# Patient Record
Sex: Female | Born: 1995 | Race: Black or African American | Hispanic: No | Marital: Single | Smoking: Current some day smoker
Health system: Southern US, Community
[De-identification: ages and names within clinical notes are randomized; demographics above are authoritative.]

## PROBLEM LIST (undated history)

## (undated) HISTORY — PX: CARDIAC SURGERY: SHX584

---

## 2016-01-13 ENCOUNTER — Encounter (HOSPITAL_COMMUNITY): Payer: Self-pay

## 2016-01-13 ENCOUNTER — Emergency Department (HOSPITAL_COMMUNITY)
Admission: EM | Admit: 2016-01-13 | Discharge: 2016-01-13 | Disposition: A | Discharge status: Home / Self Care | Payer: BC Managed Care – PPO | Attending: Emergency Medicine | Admitting: Emergency Medicine

## 2016-01-13 DIAGNOSIS — N939 Abnormal uterine and vaginal bleeding, unspecified: Secondary | ICD-10-CM | POA: Insufficient documentation

## 2016-01-13 LAB — CBC WITH DIFFERENTIAL/PLATELET
BASOS PCT: 1 %
Basophils Absolute: 0 10*3/uL (ref 0.0–0.1)
EOS ABS: 0.3 10*3/uL (ref 0.0–0.7)
EOS PCT: 4 %
HCT: 37.5 % (ref 36.0–46.0)
HEMOGLOBIN: 13.2 g/dL (ref 12.0–15.0)
LYMPHS ABS: 3.1 10*3/uL (ref 0.7–4.0)
Lymphocytes Relative: 38 %
MCH: 26.9 pg (ref 26.0–34.0)
MCHC: 35.2 g/dL (ref 30.0–36.0)
MCV: 76.4 fL — ABNORMAL LOW (ref 78.0–100.0)
MONO ABS: 0.6 10*3/uL (ref 0.1–1.0)
MONOS PCT: 7 %
NEUTROS PCT: 50 %
Neutro Abs: 4.2 10*3/uL (ref 1.7–7.7)
PLATELETS: 329 10*3/uL (ref 150–400)
RBC: 4.91 MIL/uL (ref 3.87–5.11)
RDW: 13.4 % (ref 11.5–15.5)
WBC: 8.3 10*3/uL (ref 4.0–10.5)

## 2016-01-13 LAB — WET PREP, GENITAL
Clue Cells Wet Prep HPF POC: NONE SEEN
Sperm: NONE SEEN
Trich, Wet Prep: NONE SEEN
Yeast Wet Prep HPF POC: NONE SEEN

## 2016-01-13 LAB — I-STAT BETA HCG BLOOD, ED (MC, WL, AP ONLY): I-stat hCG, quantitative: <5 m[IU]/mL

## 2016-01-13 MED ORDER — HYDROCODONE-ACETAMINOPHEN 5-325 MG PO TABS
1.0000 | ORAL_TABLET | Freq: Once | ORAL | Status: AC
  Administered 2016-01-13: 1 via ORAL
  Filled 2016-01-13: qty 1

## 2016-01-13 NOTE — ED Triage Notes (Signed)
Patient complains of heavy vaginal bleeding x 3 weeks. States that she is on depo and hasn't missed her injection. Reports some abdominal cramping with same. Currently taking flagyl for infection

## 2016-01-13 NOTE — ED Provider Notes (Signed)
[facility] DEPT Provider Note   CSN: [id number] Arrival date & time: 01/13/16  1505     History   Chief Complaint Chief Complaint  Patient presents with  . Vaginal Bleeding    HPI Miranda Case is a 20 y.o. female.   Vaginal Bleeding  Primary symptoms include pelvic pain, dyspareunia, vaginal bleeding.  Primary symptoms include no dysuria. There has been no fever. This is a new problem. The current episode started more than 1 week ago. The problem occurs daily. The problem has not changed since onset.She is not pregnant. She has not missed her period. Her LMP is unknown. The patient's menstrual history has been irregular. The discharge was bloody. Pertinent negatives include no anorexia, no diaphoresis, no abdominal swelling, no abdominal pain, no constipation, no diarrhea, no vomiting and no light-headedness. She has tried nothing for the symptoms. The treatment provided no relief. Sexual activity: non-contributory. There is no concern regarding sexually transmitted diseases. Contraceptive use: Depoprevera. Associated medical issues include vaginosis. Associated medical issues do not include STD or PID.    History reviewed. No pertinent past medical history.  There are no active problems to display for this patient.   History reviewed. No pertinent surgical history.  OB History    No data available       Home Medications    Prior to Admission medications   Not on File    Family History No family history on file.  Social History Social History  Substance Use Topics  . Smoking status: Never Smoker  . Smokeless tobacco: Not on file  . Alcohol use Not on file     Allergies   Patient has no known allergies.   Review of Systems Review of Systems  Constitutional: Negative for chills, diaphoresis and fever.  HENT: Negative for ear pain and sore throat.   Eyes: Negative for pain and visual disturbance.  Respiratory: Negative for cough and shortness of breath.    Cardiovascular: Negative for chest pain and palpitations.  Gastrointestinal: Negative for abdominal pain, anorexia, constipation, diarrhea and vomiting.  Genitourinary: Positive for dyspareunia, pelvic pain and vaginal bleeding. Negative for dysuria and hematuria.  Musculoskeletal: Negative for arthralgias and back pain.  Skin: Negative for color change and rash.  Neurological: Negative for seizures, syncope and light-headedness.  All other systems reviewed and are negative.    Physical Exam Updated Vital Signs BP 99/75   Pulse 82   Temp 98.4 F (36.9 C) (Oral)   Resp 16   SpO2 100%   Physical Exam  Constitutional: She appears well-developed and well-nourished. No distress.  HENT:  Head: Normocephalic and atraumatic.  Eyes: Conjunctivae are normal.  Neck: Neck supple.  Cardiovascular: Normal rate and regular rhythm.   No murmur heard. Pulmonary/Chest: Effort normal and breath sounds normal. No respiratory distress.  Abdominal: Soft. There is no tenderness. There is no rigidity, no rebound and no CVA tenderness.  Genitourinary: Uterus normal. Pelvic exam was performed with patient supine. There is no rash, tenderness or lesion on the right labia. There is no rash, tenderness or lesion on the left labia. Uterus is not tender. Cervix exhibits no motion tenderness, no discharge and no friability. Right adnexum displays no mass and no tenderness. Left adnexum displays no mass and no tenderness. There is bleeding in the vagina. No erythema in the vagina. No foreign body in the vagina. No signs of injury around the vagina. No vaginal discharge found.  Musculoskeletal: She exhibits no edema.  Neurological: She is alert.  Skin: Skin is warm and dry.  Psychiatric: She has a normal mood and affect.  Nursing note and vitals reviewed.    ED Treatments / Results  Labs (all labs ordered are listed, but only abnormal results are displayed) Labs Reviewed  WET PREP, GENITAL - Abnormal;  Notable for the following:       Result Value   WBC, Wet Prep HPF POC MODERATE (*)    All other components within normal limits  CBC WITH DIFFERENTIAL/PLATELET - Abnormal; Notable for the following:    MCV 76.4 (*)    All other components within normal limits  I-STAT BETA HCG BLOOD, ED ([facility], [facility], [facility] ONLY)  GC/CHLAMYDIA PROBE AMP ([place]) NOT AT [facility]    EKG  EKG Interpretation None       Radiology No results found.  Procedures Procedures (including critical care time)  Medications Ordered in ED Medications  HYDROcodone-acetaminophen (NORCO/VICODIN) 5-325 MG per tablet 1 tablet (1 tablet Oral Given 01/13/16 2314)     Initial Impression / Assessment and Plan / ED Course  I have reviewed the triage vital signs and the nursing notes.  Pertinent labs & imaging results that were available during my care of the patient were reviewed by me and considered in my medical decision making (see chart for details).  Clinical Course     20 year old female comes today with vaginal bleeding. She's been seen by her OB/GYN twice for this they believe is related to bacterial vaginosis. She's been having pelvic cramping, similar to menstrual cramping with it. She says is also painful with sex. She believes she has a latex allergy and uses condoms as there irritating her. However she's had bleeding and trouble before that event happened. Vital signs are stable she's afebrile. Hemoglobin is stable, not tachycardic not hypotensive. No syncope lightheadedness chest pain shortness of breath. Vaginal exam shows blood coming from the cervical os. She is not pregnant. No cervical motion tenderness. No friability or concerns for STD at this time. Patient has dysfunctional uterine bleeding of unknown origin at this time. She needs further outpatient management possibly a transvaginal ultrasound. She's told this and she's told for the menstrual pain she can do NSAIDs. She has tried nothing for the pain thus  far. Pain is tolerable for her. Wet prep is negative GC is pending by this time no need for treatment. Vital signs stable time discharge. Strict return precautions are given.  Final Clinical Impressions(s) / ED Diagnoses   Final diagnoses:  Vaginal bleeding    New Prescriptions There are no discharge medications for this patient.    Cherlynn PerchesEric Famous Eisenhardt, MD 01/13/16 [id number]    Canary Brimhristopher J Tegeler, MD 01/14/16 1121

## 2016-01-14 LAB — GC/CHLAMYDIA PROBE AMP (~~LOC~~) NOT AT ARMC
CHLAMYDIA, DNA PROBE: NEGATIVE
NEISSERIA GONORRHEA: NEGATIVE

## 2016-02-13 ENCOUNTER — Emergency Department (HOSPITAL_COMMUNITY): Payer: BC Managed Care – PPO

## 2016-02-13 ENCOUNTER — Encounter (HOSPITAL_COMMUNITY): Payer: Self-pay | Admitting: Emergency Medicine

## 2016-02-13 ENCOUNTER — Emergency Department (HOSPITAL_COMMUNITY)
Admission: EM | Admit: 2016-02-13 | Discharge: 2016-02-13 | Disposition: A | Discharge status: Home / Self Care | Payer: BC Managed Care – PPO | Attending: Physician Assistant | Admitting: Physician Assistant

## 2016-02-13 DIAGNOSIS — Y93G3 Activity, cooking and baking: Secondary | ICD-10-CM | POA: Diagnosis not present

## 2016-02-13 DIAGNOSIS — T300 Burn of unspecified body region, unspecified degree: Secondary | ICD-10-CM

## 2016-02-13 DIAGNOSIS — Z23 Encounter for immunization: Secondary | ICD-10-CM | POA: Diagnosis not present

## 2016-02-13 DIAGNOSIS — Y999 Unspecified external cause status: Secondary | ICD-10-CM | POA: Insufficient documentation

## 2016-02-13 DIAGNOSIS — Y929 Unspecified place or not applicable: Secondary | ICD-10-CM | POA: Diagnosis not present

## 2016-02-13 DIAGNOSIS — X102XXA Contact with fats and cooking oils, initial encounter: Secondary | ICD-10-CM | POA: Insufficient documentation

## 2016-02-13 DIAGNOSIS — T23171A Burn of first degree of right wrist, initial encounter: Secondary | ICD-10-CM | POA: Insufficient documentation

## 2016-02-13 DIAGNOSIS — T23001A Burn of unspecified degree of right hand, unspecified site, initial encounter: Secondary | ICD-10-CM | POA: Diagnosis present

## 2016-02-13 MED ORDER — OXYCODONE-ACETAMINOPHEN 5-325 MG PO TABS
1.0000 | ORAL_TABLET | Freq: Once | ORAL | Status: AC
  Administered 2016-02-13: 1 via ORAL
  Filled 2016-02-13: qty 1

## 2016-02-13 MED ORDER — SILVER SULFADIAZINE 1 % EX CREA
TOPICAL_CREAM | Freq: Once | CUTANEOUS | Status: AC
  Administered 2016-02-13: 23:00:00 via TOPICAL
  Filled 2016-02-13: qty 85

## 2016-02-13 MED ORDER — OXYCODONE-ACETAMINOPHEN 5-325 MG PO TABS: 1.0000 | ORAL_TABLET | Freq: Four times a day (QID) | ORAL | 0 refills | Status: DC | PRN

## 2016-02-13 MED ORDER — TETANUS-DIPHTH-ACELL PERTUSSIS 5-2.5-18.5 LF-MCG/0.5 IM SUSP
0.5000 mL | Freq: Once | INTRAMUSCULAR | Status: AC
  Administered 2016-02-13: 0.5 mL via INTRAMUSCULAR
  Filled 2016-02-13: qty 0.5
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
## 2016-02-13 NOTE — Discharge Instructions (Signed)
You had a small burn to your hand. If he gets any worse coming of any concerns, any increased pain, swelling or other concerns please return immediately to the Emergency Department. Otherwise please follow-up with care in 1 week.
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
## 2018-03-01 NOTE — Progress Notes (Signed)
Subjective:    Patient ID: Miranda Case, female    DOB: [birth year], 23 y.o.   MRN: [id number]  HPI Chief Complaint  Patient presents with  . new pt    new pt physical with pap and std,. had pap last year and was abnormal and was told to have another one but can't get in with obgyn. wants to STD screening   She is new to the practice and here for a complete physical exam. [facility]  Last CPE: last year   States she is en[employer] in [employer] in May. Wants to become a doctor.    Other providers: None   Recurrent BV and yeast infections. Self medicating with Boric Acid. History of abnormal pap smear and did not follow up as recommended.   On Depo Provera for 3 years and stopped it in August 2018 and no periods. First period 10/2217 and periods have been irregular since. LMP: 02/20/2018 Sexually active and has 3 female partners. Not currently using birth control.   Complains of right foot dorsal foot pain x 2 months after injuring her foot. Reports swelling and pain has improved but she continues to have a large knot on the top of her foot that is occasionally tender.   Social history: Lives with friends, works as a Fish farm managerpharm tech at [employer] Occasionally smokes and drinks alcohol, denies drug use  Diet: unhealthy snacks Excerise: 2 days per week  Immunizations: flu shot- wants this. Reports having Gardasil vaccine.   Health maintenance:  Mammogram: N/A Colonoscopy:N/A Last [facility]  Pregnancies: 0 Last Dental Exam: never  Last Eye Exam: unknown   Wears seatbelt always, smoke detectors in home and functioning, does not text while driving and feels safe in home environment.   Reviewed allergies, medications, past medical, surgical, family, and social history.   Review of Systems Review of Systems Constitutional: -fever, -chills, -sweats, -unexpected weight change,-fatigue ENT: -runny  nose, -ear pain, -sore throat Cardiology:  -chest pain, -palpitations, -edema Respiratory: -cough, -shortness of breath, -wheezing Gastroenterology: -abdominal pain, -nausea, -vomiting, -diarrhea, -constipation  Hematology: -bleeding or bruising problems Musculoskeletal: -arthralgias, -myalgias, -joint swelling, -back pain Ophthalmology: -vision changes Urology: -dysuria, -difficulty urinating, -hematuria, -urinary frequency, -urgency Neurology: -headache, -weakness, -tingling, -numbness       Objective:   Physical Exam BP 110/64   Pulse 71   Ht 5' 4.5" (1.638 m)   Wt 123 lb 12.8 oz (56.2 kg)   LMP 02/20/2018   SpO2 98%   BMI 20.92 kg/m   General Appearance:    Alert, cooperative, no distress, appears stated age  Head:    Normocephalic, without obvious abnormality, atraumatic  Eyes:    PERRL, conjunctiva/corneas clear, EOM's intact, fundi    benign  Ears:    Normal TM's and external ear canals  Nose:   Nares normal, mucosa normal, no drainage or sinus   tenderness  Throat:   Lips, mucosa, and tongue normal; teeth and gums normal  Neck:   Supple, no lymphadenopathy;  thyroid:  no   enlargement/tenderness/nodules; no carotid   bruit or JVD  Back:    Spine nontender, no curvature, ROM normal, no CVA     tenderness  Lungs:     Clear to auscultation bilaterally without wheezes, rales or     ronchi; respirations unlabored  Chest Wall:    No tenderness or deformity   Heart:    Regular rate and rhythm, S1  and S2 normal, no murmur, rub   or gallop  Breast Exam:    OB/GYN  Abdomen:     Soft, non-tender, nondistended, normoactive bowel sounds,    no masses, no hepatosplenomegaly  Genitalia:    OB/GYN  Rectal:    Not performed due to age<40 and no related complaints  Extremities:   No clubbing, cyanosis or edema. Right dorsal mid foot with area of fullness, non tender. Right foot is neurovascularly intact.   Pulses:   2+ and symmetric all extremities  Skin:   Skin color, texture,  turgor normal, maculopapular rash to right anterior lower leg  Lymph nodes:   Cervical, supraclavicular, and axillary nodes normal  Neurologic:   CNII-XII intact, normal strength, sensation and gait; reflexes 2+ and symmetric throughout          Psych:   Normal mood, affect, hygiene and grooming.     Urinalysis dipstick: negative UPT negative       Assessment & Plan:  Routine general medical examination at a health care facility - Plan: POCT Urinalysis DIP (Proadvantage Device), CBC with Differential/Platelet, Comprehensive metabolic panel, TSH, T4, free, T3, Lipid panel  History of abnormal cervical Pap smear  At risk for sexually transmitted disease due to unprotected sex - Plan: POCT urine pregnancy, HIV Antibody (routine testing w rflx), RPR, Hepatitis C antibody, GC/Chlamydia Probe Amp  Irregular menses - Plan: POCT urine pregnancy, TSH, T4, free, T3  Recurrent vaginitis  Needs flu shot - Plan: Flu Vaccine QUAD 36+ mos IM  Screening for lipid disorders - Plan: Lipid panel  Injury of right foot, initial encounter - Plan: DG Foot Complete Right  Abnormal finding of foot - Plan: DG Foot Complete Right  She is a pleasant 23 year old female who is new to the practice and here for a CPE.  She has several complaints as well. History of recurrent BV, irregular menses and abnormal Pap smear.  Referral to OB/GYN.  Counseling on safe sex as she has multiple sexual partners.  Has not been using contraception.  Discussed the possibility of prep.  She will discuss this with her OB/GYN. 2749-month history of right foot pain that is intermittent and abnormality noted.  X-ray ordered. Counseling on healthy lifestyle including diet and exercise. Immunization counseling done.  Flu shot given. Discussed safety and health promotion. Follow-up pending labs

## 2018-03-02 ENCOUNTER — Encounter: Payer: Self-pay | Admitting: Family Medicine

## 2018-03-02 ENCOUNTER — Ambulatory Visit
Admission: RE | Admit: 2018-03-02 | Discharge: 2018-03-02 | Disposition: A | Discharge status: Home / Self Care | Payer: BC Managed Care – PPO | Source: Ambulatory Visit | Attending: Family Medicine | Admitting: Family Medicine

## 2018-03-02 ENCOUNTER — Ambulatory Visit: Payer: BC Managed Care – PPO | Admitting: Family Medicine

## 2018-03-02 VITALS — BP 110/64 | HR 71 | Ht 64.5 in | Wt 123.8 lb

## 2018-03-02 DIAGNOSIS — Z8742 Personal history of other diseases of the female genital tract: Secondary | ICD-10-CM

## 2018-03-02 DIAGNOSIS — Z9189 Other specified personal risk factors, not elsewhere classified: Secondary | ICD-10-CM | POA: Diagnosis not present

## 2018-03-02 DIAGNOSIS — N760 Acute vaginitis: Secondary | ICD-10-CM | POA: Diagnosis not present

## 2018-03-02 DIAGNOSIS — N926 Irregular menstruation, unspecified: Secondary | ICD-10-CM | POA: Diagnosis not present

## 2018-03-02 DIAGNOSIS — Z23 Encounter for immunization: Secondary | ICD-10-CM

## 2018-03-02 DIAGNOSIS — Z0000 Encounter for general adult medical examination without abnormal findings: Secondary | ICD-10-CM

## 2018-03-02 DIAGNOSIS — R2991 Unspecified symptoms and signs involving the musculoskeletal system: Secondary | ICD-10-CM

## 2018-03-02 DIAGNOSIS — Z13220 Encounter for screening for lipoid disorders: Secondary | ICD-10-CM | POA: Diagnosis not present

## 2018-03-02 DIAGNOSIS — S99921A Unspecified injury of right foot, initial encounter: Secondary | ICD-10-CM

## 2018-03-02 LAB — POCT URINALYSIS DIP (PROADVANTAGE DEVICE)
Bilirubin, UA: NEGATIVE
GLUCOSE UA: NEGATIVE mg/dL
Ketones, POC UA: NEGATIVE mg/dL
Leukocytes, UA: NEGATIVE
Nitrite, UA: NEGATIVE
PH UA: 6 (ref 5.0–8.0)
Protein Ur, POC: NEGATIVE mg/dL
RBC UA: NEGATIVE
SPECIFIC GRAVITY, URINE: 1.03
Urobilinogen, Ur: NEGATIVE

## 2018-03-02 LAB — POCT URINE PREGNANCY: PREG TEST UR: NEGATIVE

## 2018-03-02 NOTE — Patient Instructions (Addendum)
[facility] for your [facility]. We will call you with your result.     Call and schedule with a local OB/GYN. Let us know which one you will be seeing so that I can forward your notes and results from today to them prior to your appointment.    Obgyn Offices:   [facility] [address] [place] [id number] [phone]  [facility] Address: [address] [place], [place] [address] Phone: [phone]  [facility] [address] [place], [place] [address] Phone: [phone]  [facility] [address] [place], [place] [address] Phone: [phone]    Safe Sex Practicing safe sex means taking steps before and during sex to reduce your risk of:  Getting an STD (sexually transmitted disease).  Giving your partner an STD.  Unwanted pregnancy. How can I practice safe sex? To practice safe sex:  Limit your sexual partners to only one partner who is having sex with only you.  Avoid using alcohol and recreational drugs before having sex. These substances can affect your judgment.  Before having sex with a new partner: ? Talk to your partner about past partners, past STDs, and drug use. ? You and your partner should be screened for STDs and discuss the results with each other.  Check your body regularly for sores, blisters, rashes, or unusual discharge. If you notice any of these problems, visit your health care provider.  If you have symptoms of an infection or you are being treated for an STD, avoid sexual contact.  While having sex, use a condom. Make sure to: ? Use a condom every time you have vaginal, oral, or anal sex. Both females and males should wear condoms during oral sex. ? Keep condoms in place from the beginning to the end of sexual activity. ? Use a latex condom, if possible. Latex condoms offer the best protection. ? Use only water-based  lubricants or oils to lubricate a condom. Using petroleum-based lubricants or oils will weaken the condom and increase the chance that it will break.  See your health care provider for regular screenings, exams, and tests for STDs.  Talk with your health care provider about the form of birth control (contraception) that is best for you.  Get vaccinated against hepatitis B and human papillomavirus (HPV).  If you are at risk of being infected with HIV (human immunodeficiency virus), talk with your health care provider about taking a prescription medicine to prevent HIV infection. You are considered at risk for HIV if: ? You are a man who has sex with other men. ? You are a heterosexual man or woman who is sexually active with more than one partner. ? You take drugs by injection. ? You are sexually active with a partner who has HIV. This information is not intended to replace advice given to you by your health care provider. Make sure you discuss any questions you have with your health care provider. Document Released: 03/10/2004 Document Revised: 06/17/2015 Document Reviewed: 12/21/2014 Elsevier Interactive Patient Education  2019 [employer]

## 2018-03-03 LAB — HIV ANTIBODY (ROUTINE TESTING W REFLEX): HIV Screen 4th Generation wRfx: NONREACTIVE

## 2018-03-03 LAB — LIPID PANEL
CHOL/HDL RATIO: 2.2 ratio (ref 0.0–4.4)
Cholesterol, Total: 136 mg/dL (ref 100–199)
HDL: 63 mg/dL (ref >39)
LDL CALC: 61 mg/dL (ref 0–99)
TRIGLYCERIDES: 62 mg/dL (ref 0–149)
VLDL Cholesterol Cal: 12 mg/dL (ref 5–40)

## 2018-03-03 LAB — COMPREHENSIVE METABOLIC PANEL
A/G RATIO: 1.4 (ref 1.2–2.2)
ALBUMIN: 4.2 g/dL (ref 3.5–5.5)
ALT: 10 IU/L (ref 0–32)
AST: 14 IU/L (ref 0–40)
Alkaline Phosphatase: 63 IU/L (ref 39–117)
BUN/Creatinine Ratio: 14 (ref 9–23)
BUN: 12 mg/dL (ref 6–20)
Bilirubin Total: 0.9 mg/dL (ref 0.0–1.2)
CALCIUM: 9.6 mg/dL (ref 8.7–10.2)
CO2: 22 mmol/L (ref 20–29)
CREATININE: 0.87 mg/dL (ref 0.57–1.00)
Chloride: 105 mmol/L (ref 96–106)
GFR, EST AFRICAN AMERICAN: 109 mL/min/{1.73_m2} (ref >59)
GFR, EST NON AFRICAN AMERICAN: 95 mL/min/{1.73_m2} (ref >59)
GLOBULIN, TOTAL: 3 g/dL (ref 1.5–4.5)
Glucose: 87 mg/dL (ref 65–99)
POTASSIUM: 4.4 mmol/L (ref 3.5–5.2)
SODIUM: 141 mmol/L (ref 134–144)
TOTAL PROTEIN: 7.2 g/dL (ref 6.0–8.5)

## 2018-03-03 LAB — CBC WITH DIFFERENTIAL/PLATELET
BASOS: 1 %
Basophils Absolute: 0.1 10*3/uL (ref 0.0–0.2)
EOS (ABSOLUTE): 0.3 10*3/uL (ref 0.0–0.4)
EOS: 5 %
HEMATOCRIT: 37.8 % (ref 34.0–46.6)
HEMOGLOBIN: 12.4 g/dL (ref 11.1–15.9)
IMMATURE GRANS (ABS): 0 10*3/uL (ref 0.0–0.1)
IMMATURE GRANULOCYTES: 0 %
LYMPHS: 28 %
Lymphocytes Absolute: 2 10*3/uL (ref 0.7–3.1)
MCH: 27.6 pg (ref 26.6–33.0)
MCHC: 32.8 g/dL (ref 31.5–35.7)
MCV: 84 fL (ref 79–97)
MONOCYTES: 10 %
MONOS ABS: 0.7 10*3/uL (ref 0.1–0.9)
Neutrophils Absolute: 4.1 10*3/uL (ref 1.4–7.0)
Neutrophils: 56 %
Platelets: 327 10*3/uL (ref 150–450)
RBC: 4.5 x10E6/uL (ref 3.77–5.28)
RDW: 13.9 % (ref 11.7–15.4)
WBC: 7.2 10*3/uL (ref 3.4–10.8)

## 2018-03-03 LAB — T4, FREE: FREE T4: 1.34 ng/dL (ref 0.82–1.77)

## 2018-03-03 LAB — RPR: RPR Ser Ql: NONREACTIVE

## 2018-03-03 LAB — T3: T3, Total: 93 ng/dL (ref 71–180)

## 2018-03-03 LAB — HEPATITIS C ANTIBODY: Hep C Virus Ab: <0.1 s/co ratio (ref 0.0–0.9)

## 2018-03-03 LAB — TSH: TSH: 2.8 u[IU]/mL (ref 0.450–4.500)

## 2018-03-06 LAB — GC/CHLAMYDIA PROBE AMP
Chlamydia trachomatis, NAA: NEGATIVE
Neisseria gonorrhoeae by PCR: NEGATIVE

## 2018-03-22 ENCOUNTER — Other Ambulatory Visit: Payer: Self-pay | Admitting: Nurse Practitioner

## 2018-03-22 ENCOUNTER — Other Ambulatory Visit (HOSPITAL_COMMUNITY): Payer: Self-pay | Admitting: Nurse Practitioner

## 2018-03-22 DIAGNOSIS — Z8679 Personal history of other diseases of the circulatory system: Secondary | ICD-10-CM

## 2018-03-23 ENCOUNTER — Encounter: Payer: Self-pay | Admitting: Nurse Practitioner

## 2018-03-23 ENCOUNTER — Telehealth: Payer: Self-pay | Admitting: Cardiology

## 2018-03-23 NOTE — Telephone Encounter (Signed)
Disregard

## 2018-03-28 ENCOUNTER — Ambulatory Visit (HOSPITAL_COMMUNITY): Payer: BC Managed Care – PPO | Attending: Cardiology

## 2018-03-28 DIAGNOSIS — Z8679 Personal history of other diseases of the circulatory system: Secondary | ICD-10-CM | POA: Diagnosis present

## 2018-05-30 ENCOUNTER — Telehealth: Payer: Self-pay | Admitting: Cardiology

## 2018-05-30 NOTE — Telephone Encounter (Signed)
New Message:     Pt says she needs ASV with diagnosis, prognosis, treatment plan, functional limitations and including documentation of release from care without limitations. She needs thiss for admission into the [facility]. Her e-mail address is [email or web address].

## 2018-05-30 NOTE — Telephone Encounter (Signed)
Reviewed chart.  Pt has never been seen in the office or in the hospital by one of our providers.  Had echo done here in January, ordered by PCP.  Per DPR, ok to leave detailed message.  Left message letting pt know that we can not provide a letter with this information as no one in the office has ever seen her before.  Advised she should reach out to her PCP for assistance with this.  Advised to call back if any questions.
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
